# Patient Record
Sex: Male | Born: 1990 | Race: Black or African American | Hispanic: No | Marital: Single
Health system: Southern US, Community
[De-identification: ages and names within clinical notes are randomized; demographics above are authoritative.]

---
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
## 2017-09-12 ENCOUNTER — Encounter (HOSPITAL_COMMUNITY): Payer: Self-pay | Admitting: Emergency Medicine

## 2017-09-12 ENCOUNTER — Emergency Department (HOSPITAL_COMMUNITY)
Admission: EM | Admit: 2017-09-12 | Discharge: 2017-09-12 | Disposition: A | Discharge status: Home / Self Care | Payer: Self-pay | Attending: Emergency Medicine | Admitting: Emergency Medicine

## 2017-09-12 DIAGNOSIS — L03114 Cellulitis of left upper limb: Secondary | ICD-10-CM | POA: Insufficient documentation

## 2017-09-12 MED ORDER — CEPHALEXIN 500 MG PO CAPS: 500.0000 mg | ORAL_CAPSULE | Freq: Four times a day (QID) | ORAL | 0 refills | Status: DC

## 2017-09-12 NOTE — ED Notes (Signed)
ED Provider at bedside. 

## 2017-09-12 NOTE — ED Triage Notes (Addendum)
Patient c/o insect bite to left forearm x2 days ago. Swelling noted to left arm. Reports drainage from site. Denies fever, N/V/D.

## 2017-09-12 NOTE — Discharge Instructions (Addendum)
Wound Care - After I&D of Abscess  Clean the wound and surrounding area gently with tap water and mild soap. Rinse well and blot dry. You may shower normally. Soaking the wound in Epsom salt baths for no more than 15 minutes once a day may help rinse out any remaining pus and help with wound healing.  Clean the wound daily to prevent further infection. Do not use cleaners such as hydrogen peroxide or alcohol.  Please take all of your antibiotics until finished!   You may develop abdominal discomfort or diarrhea from the antibiotic.  You may help offset this with probiotics which you can buy or get in yogurt. Do not eat or take the probiotics until 2 hours after your antibiotic.   Pain: You may use Tylenol, naproxen, or ibuprofen for pain.  Follow up: Please return to the ED or go to your primary care provider in 2-3 days for a wound check to assure proper healing.  Return to the ED sooner should signs of worsening infection arise, such as spreading redness, worsening puffiness/swelling, severe increase in pain, fever over 100.8F, or any other major issues.

## 2017-09-12 NOTE — ED Provider Notes (Signed)
[facility]-EMERGENCY DEPT Provider Note   CSN: [id number] Arrival date & time: 09/12/17  1515     History   Chief Complaint Chief Complaint  Patient presents with  . Insect Bite    HPI Joseph Watkins is a 27 y.o. male.  HPI  Joseph Watkins is a 27 y.o. male, patient with no pertinent past medical history, presenting to the ED with an area of pain, swelling, and redness to the left forearm beginning 2 days ago.  Area began to drain yesterday and has continued to drain today.  Pain is moderate, sharp, nonradiating.  Denies numbness, weakness, fever, or any other complaints.      History reviewed. No pertinent past medical history.  There are no active problems to display for this patient.   History reviewed. No pertinent surgical history.      Home Medications    Prior to Admission medications   Medication Sig Start Date End Date Taking? Authorizing Provider  cephALEXin (KEFLEX) 500 MG capsule Take 1 capsule (500 mg total) by mouth 4 (four) times daily. 09/12/17   Palin Tristan, Hillard Danker, PA-C    Family History No family history on file.  Social History Social History   Tobacco Use  . Smoking status: Not on file  Substance Use Topics  . Alcohol use: Not on file  . Drug use: Not on file     Allergies   Patient has no known allergies.   Review of Systems Review of Systems  Constitutional: Negative for fever.  Skin: Positive for color change and wound.     Physical Exam Updated Vital Signs BP 136/81   Pulse 74   Temp 98.1 F (36.7 C) (Oral)   Resp 18   SpO2 98%   Physical Exam  Constitutional: He appears well-developed and well-nourished. No distress.  HENT:  Head: Normocephalic and atraumatic.  Eyes: Conjunctivae are normal.  Neck: Neck supple.  Cardiovascular: Normal rate and regular rhythm.  Pulmonary/Chest: Effort normal.  Neurological: He is alert.  Skin: Skin is warm and dry. He is not diaphoretic. There is erythema.  No pallor.  Area of erythema, induration, and tenderness to the left, proximal, ulnar forearm.  Does not cross the elbow joint.  No active hemorrhage or discharge.  Psychiatric: He has a normal mood and affect. His behavior is normal.  Nursing note and vitals reviewed.    ED Treatments / Results  Labs (all labs ordered are listed, but only abnormal results are displayed) Labs Reviewed - No data to display  EKG None  Radiology No results found.  Procedures [place] bedside Date/Time: 09/12/2017 5:00 PM Performed by: Anselm Pancoast, PA-C Authorized by: Anselm Pancoast, PA-C  Consent: Verbal consent obtained. Risks and benefits: risks, benefits and alternatives were discussed Consent given by: patient Patient identity confirmed: verbally with patient and provided demographic data Local anesthesia used: no  Anesthesia: Local anesthesia used: no  Sedation: Patient sedated: no  Patient tolerance: Patient tolerated the procedure well with no immediate complications    (including critical care time)  EMERGENCY DEPARTMENT US SOFT TISSUE INTERPRETATION "Study: Limited Soft Tissue Ultrasound"  INDICATIONS: Pain and Soft tissue infection Multiple views of the body part were obtained in real-time with a multi-frequency linear probe  PERFORMED BY: Myself IMAGES ARCHIVED?: Yes SIDE:Left BODY PART:Upper extremity INTERPRETATION:  Small area of fluid collection with surrounding evidence of cellulitis    Medications Ordered in ED Medications - No data to display   Initial  Impression / Assessment and Plan / ED Course  I have reviewed the triage vital signs and the nursing notes.  Pertinent labs & imaging results that were available during my care of the patient were reviewed by me and considered in my medical decision making (see chart for details).     Patient presents with an area pain and swelling to the left forearm.  Small area of fluid collection with surrounding cellulitis.   Patient reports consistent drainage from the area.  Since the fluid collection is small and patient reports consistent drainage, we will treat with antibiotics.  Wound check in 2 to 3 days. The patient was given instructions for home care as well as return precautions. Patient voices understanding of these instructions, accepts the plan, and is comfortable with discharge.    Final Clinical Impressions(s) / ED Diagnoses   Final diagnoses:  Cellulitis of left upper extremity    ED Discharge Orders        Ordered    cephALEXin (KEFLEX) 500 MG capsule  4 times daily     09/12/17 1700       Anselm PancoastJoy, Kashaun Bebo C, PA-C 09/12/17 1712    Wynetta FinesMessick, Peter C, MD 09/12/17 2312

## 2020-11-02 ENCOUNTER — Emergency Department
Admission: EM | Admit: 2020-11-02 | Discharge: 2020-11-02 | Disposition: A | Discharge status: Home / Self Care | Payer: Self-pay | Attending: Emergency Medicine | Admitting: Emergency Medicine

## 2020-11-02 ENCOUNTER — Other Ambulatory Visit: Payer: Self-pay

## 2020-11-02 ENCOUNTER — Encounter: Payer: Self-pay | Admitting: *Deleted

## 2020-11-02 DIAGNOSIS — J020 Streptococcal pharyngitis: Secondary | ICD-10-CM | POA: Insufficient documentation

## 2020-11-02 DIAGNOSIS — Z20822 Contact with and (suspected) exposure to covid-19: Secondary | ICD-10-CM | POA: Insufficient documentation

## 2020-11-02 LAB — RESP PANEL BY RT-PCR (FLU A&B, COVID) ARPGX2
Influenza A by PCR: NEGATIVE
Influenza B by PCR: NEGATIVE
SARS Coronavirus 2 by RT PCR: NEGATIVE

## 2020-11-02 LAB — GROUP A STREP BY PCR: Group A Strep by PCR: DETECTED — AB

## 2020-11-02 MED ORDER — AMOXICILLIN 875 MG PO TABS: 875.0000 mg | ORAL_TABLET | Freq: Two times a day (BID) | ORAL | 0 refills | Status: AC

## 2020-11-02 MED ORDER — AMOXICILLIN 875 MG PO TABS: 875.0000 mg | ORAL_TABLET | Freq: Two times a day (BID) | ORAL | 0 refills | Status: DC

## 2020-11-02 NOTE — ED Notes (Signed)
See triage note  Presents with sore throat   States sx's started yesterday  Denies any fever and is afebrile on arrival

## 2020-11-02 NOTE — ED Triage Notes (Signed)
Sore throat since yesterday.

## 2020-11-02 NOTE — Discharge Instructions (Addendum)
Follow-up with your regular doctor if not improving in 2 to 3 days.  Return emergency department worsening Take the amoxicillin as prescribed Gargle with warm salt water as this is a natural antiseptic You can also gargle with Listerine which will help numb the back of the throat. Tylenol or ibuprofen for pain as needed You are contagious for the next 24 to 48 hours.

## 2020-11-02 NOTE — ED Provider Notes (Signed)
[facility] Emergency Department Provider Note  ____________________________________________   Event Date/Time   First MD Initiated Contact with Patient 11/02/20 0710     (approximate)  I have reviewed the triage vital signs and the nursing notes.   HISTORY  Chief Complaint Sore Throat    HPI Joseph Watkins is a 30 y.o. male presents emergency department with a sore throat.  Patient states symptoms started yesterday.  Patient was recently out of town and had a negative COVID test last week.  States it is very painful to swallow.  No fever or chills.  No cough or congestion.  Unsure of COVID exposure.  History reviewed. No pertinent past medical history.  There are no problems to display for this patient.   History reviewed. No pertinent surgical history.  Prior to Admission medications   Medication Sig Start Date End Date Taking? Authorizing Provider  amoxicillin (AMOXIL) 875 MG tablet Take 1 tablet (875 mg total) by mouth 2 (two) times daily. 11/02/20   Faythe Ghee, PA-C    Allergies Patient has no known allergies.  No family history on file.  Social History    Review of Systems  Constitutional: No fever/chills Eyes: No visual changes. ENT: Positive sore throat. Respiratory: Denies cough Cardiovascular: Denies chest pain Gastrointestinal: Denies abdominal pain Genitourinary: Negative for dysuria. Musculoskeletal: Negative for back pain. Skin: Negative for rash. Psychiatric: no mood changes,     ____________________________________________   PHYSICAL EXAM:  VITAL SIGNS: ED Triage Vitals  Enc Vitals Group     BP 11/02/20 0650 116/77     Pulse Rate 11/02/20 0650 63     Resp 11/02/20 0650 18     Temp 11/02/20 0650 98.8 F (37.1 C)     Temp Source 11/02/20 0650 Oral     SpO2 11/02/20 0650 99 %     Weight 11/02/20 0648 200 lb (90.7 kg)     Height 11/02/20 0648 6\' 1"  (1.854 m)     Head Circumference --      Peak Flow --       Pain Score 11/02/20 0648 8     Pain Loc --      Pain Edu? --      Excl. in GC? --     Constitutional: Alert and oriented. Well appearing and in no acute distress. Eyes: Conjunctivae are normal.  Head: Atraumatic. Nose: No congestion/rhinnorhea. Mouth/Throat: Mucous membranes are moist.  Throat is mildly irritated Neck:  supple no lymphadenopathy noted Cardiovascular: Normal rate, regular rhythm. Heart sounds are normal Respiratory: Normal respiratory effort.  No retractions, lungs c t a  GU: deferred Musculoskeletal: FROM all extremities, warm and well perfused Neurologic:  Normal speech and language.  Skin:  Skin is warm, dry and intact. No rash noted. Psychiatric: Mood and affect are normal. Speech and behavior are normal.  ____________________________________________   LABS (all labs ordered are listed, but only abnormal results are displayed)  Labs Reviewed  GROUP A STREP BY PCR - Abnormal; Notable for the following components:      Result Value   Group A Strep by PCR DETECTED (*)    All other components within normal limits  RESP PANEL BY RT-PCR (FLU A&B, COVID) [id number]   ____________________________________________   ____________________________________________  RADIOLOGY    ____________________________________________   PROCEDURES  Procedure(s) performed: No  Procedures    ____________________________________________   INITIAL IMPRESSION / ASSESSMENT AND PLAN / ED COURSE  Pertinent labs & imaging results that were  available during my care of the patient were reviewed by me and considered in my medical decision making (see chart for details).   Patient's 30 year old male presents emergency department with sore throat.  See HPI.  Physical exam shows patient per stable  Strep and COVID test ordered  Strep test positive  To explain the findings to the patient.  He is to follow-up with his regular doctor 5.  3 days.  Prescription for amoxicillin.   Take Tylenol/ibuprofen.  Patient requested a work note and a explanation of why he needs to be out.  I did explain in the work note that he has strep throat will be contagious until he has had 24 hours of antibiotics.  This was at his request and with his permission.  He was discharged stable condition.  Also explained if his COVID test positive he will need to quarantine for 10 days.  Joseph Watkins was evaluated in Emergency Department on 11/02/2020 for the symptoms described in the history of present illness. He was evaluated in the context of the global COVID-19 pandemic, which necessitated consideration that the patient might be at risk for infection with the SARS-CoV-2 virus that causes COVID-19. Institutional protocols and algorithms that pertain to the evaluation of patients at risk for COVID-19 are in a state of rapid change based on information released by regulatory bodies including the CDC and federal and state organizations. These policies and algorithms were followed during the patient's care in the ED.    As part of my medical decision making, I reviewed the following data within the electronic MEDICAL RECORD NUMBER Nursing notes reviewed and incorporated, Labs reviewed , Old chart reviewed, Notes from prior ED visits, and [place] Controlled Substance Database  ____________________________________________   FINAL CLINICAL IMPRESSION(S) / ED DIAGNOSES  Final diagnoses:  Acute streptococcal pharyngitis      NEW MEDICATIONS STARTED DURING THIS VISIT:  Current Discharge Medication List     START taking these medications   Details  amoxicillin (AMOXIL) 875 MG tablet Take 1 tablet (875 mg total) by mouth 2 (two) times daily. Qty: 20 tablet, Refills: 0         Note:  This document was prepared using Dragon voice recognition software and may include unintentional dictation errors.    Faythe Ghee, PA-C 11/02/20 6063    Gilles Chiquito, MD 11/02/20 1247
